# Patient Record
Sex: Female | Born: 1975 | Race: Black or African American | Hispanic: No | Smoking: Never smoker
Health system: Southern US, Community
[De-identification: ages and names within clinical notes are randomized; demographics above are authoritative.]

## PROBLEM LIST (undated history)

## (undated) DIAGNOSIS — E669 Obesity, unspecified: Secondary | ICD-10-CM

## (undated) DIAGNOSIS — I10 Essential (primary) hypertension: Secondary | ICD-10-CM

## (undated) HISTORY — PX: ECTOPIC PREGNANCY SURGERY: SHX613

---

## 2011-09-07 ENCOUNTER — Other Ambulatory Visit: Payer: Self-pay

## 2011-09-07 ENCOUNTER — Encounter (HOSPITAL_COMMUNITY): Payer: Self-pay | Admitting: Emergency Medicine

## 2011-09-07 ENCOUNTER — Emergency Department (HOSPITAL_COMMUNITY)
Admission: EM | Admit: 2011-09-07 | Discharge: 2011-09-08 | Disposition: A | Discharge status: Home / Self Care | Payer: Self-pay | Attending: Emergency Medicine | Admitting: Emergency Medicine

## 2011-09-07 DIAGNOSIS — R5381 Other malaise: Secondary | ICD-10-CM | POA: Insufficient documentation

## 2011-09-07 DIAGNOSIS — R071 Chest pain on breathing: Secondary | ICD-10-CM | POA: Insufficient documentation

## 2011-09-07 DIAGNOSIS — R0789 Other chest pain: Secondary | ICD-10-CM

## 2011-09-07 DIAGNOSIS — I10 Essential (primary) hypertension: Secondary | ICD-10-CM | POA: Insufficient documentation

## 2011-09-07 DIAGNOSIS — I517 Cardiomegaly: Secondary | ICD-10-CM | POA: Insufficient documentation

## 2011-09-07 DIAGNOSIS — R5383 Other fatigue: Secondary | ICD-10-CM | POA: Insufficient documentation

## 2011-09-07 DIAGNOSIS — R079 Chest pain, unspecified: Secondary | ICD-10-CM | POA: Insufficient documentation

## 2011-09-07 HISTORY — DX: Essential (primary) hypertension: I10

## 2011-09-07 NOTE — ED Notes (Signed)
Pt alert, nad, c/o "chest pain", onset several days ago, worse today, resp even unlabored, skin pwd, denies n/v

## 2011-09-07 NOTE — ED Notes (Signed)
Pt states she took four 81mg  ASA pta

## 2011-09-08 ENCOUNTER — Emergency Department (HOSPITAL_COMMUNITY): Payer: Self-pay

## 2011-09-08 LAB — POCT I-STAT, CHEM 8
BUN: 16 mg/dL (ref 6–23)
Creatinine, Ser: 0.6 mg/dL (ref 0.50–1.10)
Sodium: 141 mEq/L (ref 135–145)
TCO2: 23 mmol/L (ref 0–100)

## 2011-09-08 LAB — URINALYSIS, ROUTINE W REFLEX MICROSCOPIC
Ketones, ur: NEGATIVE mg/dL
Leukocytes, UA: NEGATIVE
Nitrite: NEGATIVE
Specific Gravity, Urine: 1.027 (ref 1.005–1.030)
pH: 5.5 (ref 5.0–8.0)

## 2011-09-08 LAB — POCT I-STAT TROPONIN I

## 2011-09-08 LAB — URINE MICROSCOPIC-ADD ON

## 2011-09-08 MED ORDER — TRAMADOL HCL 50 MG PO TABS: 50.0000 mg | ORAL_TABLET | Freq: Four times a day (QID) | ORAL | Status: AC | PRN

## 2011-09-08 MED ORDER — LISINOPRIL 5 MG PO TABS: 5.0000 mg | ORAL_TABLET | Freq: Every day | ORAL | Status: DC

## 2011-09-08 NOTE — ED Notes (Signed)
Attempted IV X2 without success.

## 2011-09-08 NOTE — ED Notes (Signed)
Patient transported to X-ray 

## 2011-09-08 NOTE — ED Provider Notes (Signed)
History     CSN: [id number]  Arrival date & time 09/07/11  2317   First MD Initiated Contact with Patient 09/07/11 2358      Chief Complaint  Patient presents with  . Chest Pain   HPI Pt was seen at 2355.  Per pt, c/o gradual onset and persistence of multiple intermittent episodes of chest "pain" that began 2 days ago.  Describes the pain as "sharp" and "stabbing," lasts 1-2 min before resolving spontaneously.  Discomfort does not radiate, is not assoc specifically with activity/rest/or meals, and is not assoc with any other symptoms.  Also states she has run out of her BP meds this past week and is requesting a med refill.  Denies SOB/cough, no palpitations, no fevers, no back pain, no abd pain, no N/V/D, no rash, no visual changes, no focal motor weakness, no tingling/numbness in extremities.   Past Medical History  Diagnosis Date  . Hypertension     Past Surgical History  Procedure Date  . Cesarean section      History  Substance Use Topics  . Smoking status: Never Smoker   . Smokeless tobacco: Not on file  . Alcohol Use: No    Review of Systems ROS: Statement: All systems negative except as marked or noted in the HPI; Constitutional: Negative for fever and chills. ; ; Eyes: Negative for eye pain, redness and discharge. ; ; ENMT: Negative for ear pain, hoarseness, nasal congestion, sinus pressure and sore throat. ; ; Cardiovascular: +chest pain. Negative for palpitations, diaphoresis, dyspnea and peripheral edema. ; ; Respiratory: Negative for cough, wheezing and stridor. ; ; Gastrointestinal: Negative for nausea, vomiting, diarrhea, abdominal pain, blood in stool, hematemesis, jaundice and rectal bleeding. . ; ; Genitourinary: Negative for dysuria, flank pain and hematuria. ; ; Musculoskeletal: Negative for back pain and neck pain. Negative for swelling and trauma.; ; Skin: Negative for pruritus, rash, abrasions, blisters, bruising and skin lesion.; ; Neuro: Negative for  headache, lightheadedness and neck stiffness. Negative for weakness, altered level of consciousness , altered mental status, extremity weakness, paresthesias, involuntary movement, seizure and syncope.     Allergies  Penicillins  Home Medications   Current Outpatient Rx  Name Route Sig Dispense Refill  . LISINOPRIL 5 MG PO TABS Oral Take 5 mg by mouth daily.      BP 119/70  Pulse 91  Temp(Src) 98 F (36.7 C) (Oral)  Resp 16  Wt 300 lb (136.079 kg)  SpO2 100%  LMP 08/24/2011  Physical Exam 0005: Physical examination:  Nursing notes reviewed; Vital signs and O2 SAT reviewed;  Constitutional: Well developed, Well nourished, Well hydrated, In no acute distress; Head:  Normocephalic, atraumatic; Eyes: EOMI, PERRL, No scleral icterus; ENMT: Mouth and pharynx normal, Mucous membranes moist; Neck: Supple, Full range of motion, No lymphadenopathy; Cardiovascular: Regular rate and rhythm, No murmur, rub, or gallop; Respiratory: Breath sounds clear & equal bilaterally, No rales, rhonchi, wheezes, or rub, Normal respiratory effort/excursion; Chest: Nontender, Movement normal; Abdomen: Soft, Nontender, Nondistended, Normal bowel sounds; Extremities: Pulses normal, No tenderness, No edema, No calf edema or asymmetry.; Neuro: AA&Ox3, Major CN grossly intact. Speech clear, no facial droop. No gross focal motor or sensory deficits in extremities.; Skin: Color normal, Warm, Dry, no rash.    ED Course  Procedures   MDM  MDM Reviewed: nursing note and vitals Interpretation: ECG, labs and x-ray    Date: 09/08/2011  Rate: 78  Rhythm: normal sinus rhythm  QRS Axis: normal  Intervals: normal  ST/T Wave abnormalities: normal  Conduction Disutrbances:none  Narrative Interpretation:   Old EKG Reviewed: none available.    Results for orders placed during the hospital encounter of 09/07/11  URINALYSIS, ROUTINE W REFLEX MICROSCOPIC      Component Value Range   Color, Urine YELLOW  YELLOW     APPearance CLOUDY (*) CLEAR    Specific Gravity, Urine 1.027  1.005 - 1.030    pH 5.5  5.0 - 8.0    Glucose, UA NEGATIVE  NEGATIVE (mg/dL)   Hgb urine dipstick SMALL (*) NEGATIVE    Bilirubin Urine NEGATIVE  NEGATIVE    Ketones, ur NEGATIVE  NEGATIVE (mg/dL)   Protein, ur NEGATIVE  NEGATIVE (mg/dL)   Urobilinogen, UA 0.2  0.0 - 1.0 (mg/dL)   Nitrite NEGATIVE  NEGATIVE    Leukocytes, UA NEGATIVE  NEGATIVE   D-DIMER, QUANTITATIVE      Component Value Range   D-Dimer, Quant 0.28  0.00 - 0.48 (ug/mL-FEU)  POCT PREGNANCY, URINE      Component Value Range   Preg Test, Ur NEGATIVE  NEGATIVE   URINE MICROSCOPIC-ADD ON      Component Value Range   Squamous Epithelial / LPF MANY (*) RARE    RBC / HPF 0-2  <3 (RBC/hpf)   Bacteria, UA MANY (*) RARE    Urine-Other MUCOUS PRESENT    POCT I-STAT, CHEM 8      Component Value Range   Sodium 141  135 - 145 (mEq/L)   Potassium 3.6  3.5 - 5.1 (mEq/L)   Chloride 108  96 - 112 (mEq/L)   BUN 16  6 - 23 (mg/dL)   Creatinine, Ser 4.09  0.50 - 1.10 (mg/dL)   Glucose, Bld 88  70 - 99 (mg/dL)   Calcium, Ion 8.11  9.14 - 1.32 (mmol/L)   TCO2 23  0 - 100 (mmol/L)   Hemoglobin 11.6 (*) 12.0 - 15.0 (g/dL)   HCT 78.2 (*) 95.6 - 46.0 (%)  POCT I-STAT TROPONIN I      Component Value Range   Troponin i, poc 0.00  0.00 - 0.08 (ng/mL)   Comment 3            Dg Chest 2 View 09/08/2011  *RADIOLOGY REPORT*  Clinical Data: Weakness and hypertension.  CHEST - 2 VIEW  Comparison: None.  Findings: Mild cardiac enlargement with prominent central pulmonary vascularity.  Changes suggest arterial hypertension.  Right inferior paratracheal prominence likely representing vascular shadow.  No focal airspace consolidation.  No blunting of costophrenic angles.  No pneumothorax.  Mild degenerative changes in the spine.  IMPRESSION: Cardiac enlargement with pulmonary vascular congestion and probable pulmonary arterial hypertension changes.  No focal airspace consolidation.  Original  Report Authenticated By: Marlon Pel, M.D.     3:07 AM:  UA contaminated.  Wants to go home now.  Doubt ACS or PE as cause for symptoms today.  BP, RR, Sats stable in ED.  Given Pulm MD to f/u with re: CXR findings above.  Dx testing (inclu CXR above) d/w pt and family.  Questions answered.  Verb understanding, agreeable to d/c home with outpt f/u.        Carmen Ruiz Allison Quarry, DO 09/08/11 2123

## 2012-05-20 ENCOUNTER — Encounter (HOSPITAL_COMMUNITY): Payer: Self-pay | Admitting: *Deleted

## 2012-05-20 DIAGNOSIS — I10 Essential (primary) hypertension: Secondary | ICD-10-CM | POA: Insufficient documentation

## 2012-05-20 DIAGNOSIS — R079 Chest pain, unspecified: Principal | ICD-10-CM | POA: Insufficient documentation

## 2012-05-20 LAB — CBC WITH DIFFERENTIAL/PLATELET
Basophils Relative: 0 % (ref 0–1)
Eosinophils Relative: 0 % (ref 0–5)
HCT: 32.9 % — ABNORMAL LOW (ref 36.0–46.0)
Hemoglobin: 10.7 g/dL — ABNORMAL LOW (ref 12.0–15.0)
Lymphocytes Relative: 26 % (ref 12–46)
MCHC: 32.5 g/dL (ref 30.0–36.0)
Monocytes Relative: 9 % (ref 3–12)
Neutro Abs: 5.2 10*3/uL (ref 1.7–7.7)
RBC: 5.46 MIL/uL — ABNORMAL HIGH (ref 3.87–5.11)
WBC: 8 10*3/uL (ref 4.0–10.5)

## 2012-05-20 LAB — COMPREHENSIVE METABOLIC PANEL
ALT: 22 U/L (ref 0–35)
AST: 30 U/L (ref 0–37)
Alkaline Phosphatase: 43 U/L (ref 39–117)
CO2: 23 mEq/L (ref 19–32)
GFR calc Af Amer: >90 mL/min (ref >90)
GFR calc non Af Amer: >90 mL/min (ref >90)
Glucose, Bld: 127 mg/dL — ABNORMAL HIGH (ref 70–99)
Potassium: 3.3 mEq/L — ABNORMAL LOW (ref 3.5–5.1)
Sodium: 136 mEq/L (ref 135–145)

## 2012-05-20 NOTE — ED Notes (Signed)
No bp meds for 2 weeks but earlier today she took a benicar from a friend

## 2012-05-20 NOTE — ED Notes (Signed)
The pt has had mid-chest pain for 3-4 days with nausea.  She said her heart was beating fast earlier today

## 2012-05-20 NOTE — ED Notes (Signed)
Wait time advised 

## 2012-05-21 ENCOUNTER — Emergency Department (HOSPITAL_COMMUNITY): Payer: Self-pay

## 2012-05-21 ENCOUNTER — Encounter (HOSPITAL_COMMUNITY): Payer: Self-pay

## 2012-05-21 ENCOUNTER — Observation Stay (HOSPITAL_COMMUNITY)
Admission: EM | Admit: 2012-05-21 | Discharge: 2012-05-21 | Disposition: A | Discharge status: Home / Self Care | Payer: 59 | Attending: Emergency Medicine | Admitting: Emergency Medicine

## 2012-05-21 DIAGNOSIS — D649 Anemia, unspecified: Secondary | ICD-10-CM | POA: Diagnosis present

## 2012-05-21 DIAGNOSIS — E876 Hypokalemia: Secondary | ICD-10-CM | POA: Diagnosis present

## 2012-05-21 DIAGNOSIS — R079 Chest pain, unspecified: Principal | ICD-10-CM | POA: Diagnosis present

## 2012-05-21 DIAGNOSIS — R739 Hyperglycemia, unspecified: Secondary | ICD-10-CM | POA: Diagnosis present

## 2012-05-21 LAB — POCT I-STAT TROPONIN I: Troponin i, poc: 0 ng/mL (ref 0.00–0.08)

## 2012-05-21 MED ORDER — ASPIRIN 81 MG PO CHEW
324.0000 mg | CHEWABLE_TABLET | Freq: Once | ORAL | Status: AC
  Administered 2012-05-21: 324 mg via ORAL

## 2012-05-21 MED ORDER — LISINOPRIL 10 MG PO TABS: 10.0000 mg | ORAL_TABLET | Freq: Every day | ORAL | Status: DC

## 2012-05-21 MED ORDER — GI COCKTAIL ~~LOC~~
30.0000 mL | Freq: Once | ORAL | Status: AC
  Administered 2012-05-21: 30 mL via ORAL
  Filled 2012-05-21: qty 30

## 2012-05-21 MED ORDER — POTASSIUM CHLORIDE CRYS ER 20 MEQ PO TBCR
20.0000 meq | EXTENDED_RELEASE_TABLET | Freq: Once | ORAL | Status: AC
  Administered 2012-05-21: 20 meq via ORAL
  Filled 2012-05-21: qty 1

## 2012-05-21 MED ORDER — ASPIRIN 81 MG PO CHEW
CHEWABLE_TABLET | ORAL | Status: AC
  Filled 2012-05-21: qty 4

## 2012-05-21 NOTE — Consult Note (Signed)
CARDIOLOGY CONSULT NOTE   Patient ID: Carmen Ruiz MRN: [id number] DOB/AGE: [birth year] 36 y.o.  Admit date: 05/21/2012  Primary Physician   Dr Mayford Knife at the [facility] on [address] Primary Cardiologist   none Reason for Consultation   Chest pain  Carmen Ruiz:[id number] Cluff is a 36 y.o. female with no history of CAD. She had chest pain and came to the ER. She is over 300 lbs and they could not do a GXT echo, so cardiology was asked to evaluate her.   Pt ran out of her BP meds about 2 weeks ago. Her BP has been as high as 180/101. She began having chest pain a week ago. It was intermittent, when she moved fast or her BP was up. It was a pulling sensation just to the left of her sternum. She also got a cramping pain under her breast. It would be worse with exertion and resolve without meds. Rest did not help. No change with position, deep inspiration or cough. Chest Carmen Ruiz not tender to palpation.    She has been having approx 5 episodes per day, each one lasting < 30". She knew her BP was high and took a friend's BP med yesterday. Later yesterday (about 8 pm), after eating, pt had sharper pain, associated with diaphoresis and hot feeling all over.  The pain reached a 10/10. She came to the ER. En route, without meds, the pain resolved. A GI cocktail helped a cramping pain in her chest today. Right now, pt still having intermittent cramping pain in her chest at a 2/10. The pain concerned her and with the diaphoresis, she thought she was having a heart attack.    Past Medical History  Diagnosis Date   She has not had a full physical in years   . Hypertension      Past Surgical History  Procedure Date  . Cesarean section   . Ectopic pregnancy surgery     Allergies  Allergen Reactions  . Penicillins Anaphylaxis  . Amlodipine Other (See Comments)    Facial tingling     I have reviewed the patient's current medications    . aspirin      . aspirin  324 mg Oral Once  . gi  cocktail  30 mL Oral Once  . potassium chloride  20 mEq Oral Once   Medication Sig Start Date  amLODipine (NORVASC) 10 MG tablet Take 10 mg by mouth daily.   lisinopril (PRINIVIL,ZESTRIL) 5 MG tablet Take 1 tablet (5 mg total) by mouth daily. 09/08/11  olmesartan (BENICAR) 20 MG tablet/HCT Take 20/? mg by mouth daily.    History   Social History  . Marital Status: Single    Spouse Name: N/A    Number of Children: N/A  . Years of Education: N/A   Occupational History  . 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 Smoking status: Never Smoker   . Smokeless tobacco: Not on file  . Alcohol Use: No  . Drug Use: No  . Sexually Active: Not on file   Other Topics Concern  . Not on file   Social History Narrative   No cardiac issues in any siblings. Pt lives with 2 daughters.   Family Status  Relation Status Death Age  . Mother Alive     38s, CHF, no CAD  . Father Alive     32s, CHF, no CAD   ROS: No recent illnesses, fevers  or chills. She does not cough or wheeze. No significant edema, no PND or orthopnea. Full 14 point review of systems complete and found to be negative unless listed above.  Physical Exam: Blood pressure 118/66, pulse 97, temperature 98.4 F (36.9 C), temperature source Oral, resp. rate 23, last menstrual period 05/06/2012, SpO2 99.00%. 65" 320 lbs General: Well developed, well nourished, female in no acute distress Head: Eyes PERRLA, No xanthomas.   Normocephalic and atraumatic, oropharynx without edema or exudate. Dentition: good Lungs: Clear to auscultation bilaterally Heart: HRRR S1 S2, no rub/gallop, very soft systolic murmur. pulses are 2+ all 4 extrem.   Neck: No carotid bruits. No lymphadenopathy.  JVD not elevated. Abdomen: Bowel sounds present, abdomen soft and some minor tenderness in the mid-epigastric area, guarding or rebound, no masses or hernias noted. Msk:  No spine or cva tenderness. No weakness, no joint deformities or  effusions. Extremities: No clubbing or cyanosis. No edema.  Neuro: Alert and oriented X 3. No focal deficits noted. Psych:  Good affect, responds appropriately Skin: No rashes or lesions noted.  Labs:  Lab Results  Component Value Date   WBC 8.0 05/20/2012   HGB 10.7* 05/20/2012   HCT 32.9* 05/20/2012   MCV 60.3* 05/20/2012   PLT 268 05/20/2012    Lab 05/20/12 2050  NA 136  K 3.3*  CL 98  CO2 23  BUN 14  CREATININE 0.70  CALCIUM 10.1  PROT 7.5  BILITOT 0.2*  ALKPHOS 43  ALT 22  AST 30  GLUCOSE 127*    Basename 05/20/12 2050  CKTOTAL --  CKMB --  TROPONINI <0.30    Basename 05/21/12 0645  TROPIPOC 0.00   Lab Results  Component Value Date   DDIMER <0.27 05/21/2012   ECG: 21-May-2012 05:57:27 [facility]/ED ROUTINE RECORD SINUS RHYTHM ~ normal P axis, V-rate 50- 99 BORDERLINE T ABNORMALITIES, ANTERIOR LEADS ~ T flat or neg, V2-V4 Borderline ECG No significant change since last tracing 53mm/s 32mm/mV 150Hz  8.0.1 12SL 235 CID: [id number] Referred by: Confirmed By: Dione Booze MD Vent. rate 95 BPM PR interval 164 ms QRS duration 74 ms QT/QTc 344/432 ms P-R-T axes 55 72 7  07-Sep-2011 23:39:24  [facility] ED ROUTINE RECORD SINUS RHYTHM ~ normal P axis, V-rate 50- 99 Normal ECG No old tracing to compare 4mm/s 55mm/mV 150Hz  7.1.1 12SL 235 CID: [id number] Referred by: Confirmed By: Devoria Albe MD-I Vent. rate 78 BPM PR interval 188 ms QRS duration 82 ms QT/QTc 348/396 ms P-R-T axes 55 80 21  Radiology:  Dg Chest 2 View 05/21/2012  *RADIOLOGY REPORT*  Clinical Data: Chest pain and congestion for 2 days.  CHEST - 2 VIEW  Comparison: 09/08/2011  Findings: The heart size and pulmonary vascularity are normal. The lungs appear clear and expanded without focal air space disease or consolidation. No blunting of the costophrenic angles.  No pneumothorax.  Mediastinal contours appear intact.  Mild degenerative changes in the spine.  IMPRESSION:  No evidence of active pulmonary disease.   Original Report Authenticated By: Marlon Pel, M.D.    ASSESSMENT AND PLAN:   The patient was seen today by Dr Daleen Squibb, the patient evaluated and the data reviewed.  Principal Problem:  *Chest pain on exertion - Pt has never had an ischemic eval but her symptoms are not consistent with angina. Her ECG has some changes from an ECG in January, but her hypertension has not been treated and and she was hypokalemic at the time.  She also had LVH/strain by initial ECG, but this improved. She had a significant episode of pain last pm without any change in her enzymes. We will increase her lisinopril to 10 mg daily for better BP control. Encouraged a healthy diet with fruit for potassium supplement. Dr Daleen Squibb spent > 20 minutes discussing the patient's situation and ways to improve her health. No further in-pt workup indicated.   Active Problems:  Hypokalemia - s/p supplement, ECG changes improved this am. She was not on a daily diuretic but took some of another person's meds.   Anemia - present in Jan 2013, but slightly worse now. Her MCV is low, consider MVI. She is still menstruating.   Hyperglycemia - recheck as fasting with HgbA1c.   Signed: Theodore Demark 05/21/2012, 2:46 PM Co-Sign MD I have taken a history, reviewed medications, allergies, PMH, SH, FH, and reviewed ROS and examined the patient.  I agree with the assessment and plan. Long discussion with patient about compliance with meds for BP, weight loss, intake of potassium rich foods. EKG changes most likely secondary to LVH and hypokalemia and much improved with BP control and potassium replacement. OK to be discharged home.  Angelo Caroll C. Daleen Squibb, MD, [facility] Pager:  [phone]

## 2012-05-21 NOTE — ED Notes (Signed)
Pt. oob to the bathroom, brushed her teeth, freshened up.  Pt. Has been NPO, denies any chest pain

## 2012-05-21 NOTE — ED Notes (Signed)
Patient transported to X-ray 

## 2012-05-21 NOTE — ED Provider Notes (Signed)
Wynetta Emery, PA-C, discussed patient with Dr Daleen Squibb.  She has been seen in CDU by [facility] cardiology.  Please see consult note by Theodore Demark, PA-C.  Bjorn Loser has given patient prescription for lisinopril.  I will discharge per sign out report of their discussion and Rhonda's note.    4:06 PM I spoke with the patient who verabalizes understanding and agrees with d/c home.  Pt has resources for PCP follow up and has been given a business card by Dr Daleen Squibb.  I have encouraged follow up.    [place], [place] 05/21/12 1737

## 2012-05-21 NOTE — ED Notes (Signed)
Pt. Unable to complete Stress Echo due to weight .

## 2012-05-21 NOTE — ED Provider Notes (Signed)
History     CSN: [id number]  Arrival date & time 05/20/12  2026   First MD Initiated Contact with Patient 05/21/12 0043      Chief Complaint  Patient presents with  . Chest Pain    (Consider location/radiation/quality/duration/timing/severity/associated sxs/prior treatment) HPI Hx per PT, ran out of Lisinopril 2 weeks ago, CP started a week ago on and off worse today, feels like an ache, located L chest, no radiation, some L arm tingling. No SOB, some palpitations and states her HR has been elevated at home over 100, unusual for her. No PCP. No leg pain or swelling. No recent travel or surgery, no h/o DVT or PE. No DM, HLD or CAD. Had a stress test about 5 years ago, reports it was OK. Currently pain free, takes ASA daily.  Past Medical History  Diagnosis Date  . Hypertension     Past Surgical History  Procedure Date  . Cesarean section     No family history on file.  History  Substance Use Topics  . Smoking status: Never Smoker   . Smokeless tobacco: Not on file  . Alcohol Use: No    OB History    Grav Para Term Preterm Abortions TAB SAB Ect Mult Living                  Review of Systems  Constitutional: Negative for fever and chills.  HENT: Negative for neck pain and neck stiffness.   Eyes: Negative for pain.  Respiratory: Negative for shortness of breath.   Cardiovascular: Positive for chest pain.  Gastrointestinal: Negative for abdominal pain.  Genitourinary: Negative for dysuria.  Musculoskeletal: Negative for back pain.  Skin: Negative for rash.  Neurological: Negative for headaches.  All other systems reviewed and are negative.    Allergies  Penicillins and Amlodipine  Home Medications   Current Outpatient Rx  Name Route Sig Dispense Refill  . AMLODIPINE BESYLATE 10 MG PO TABS Oral Take 10 mg by mouth daily.    Marland Kitchen LISINOPRIL 5 MG PO TABS Oral Take 1 tablet (5 mg total) by mouth daily. 15 tablet 0  . OLMESARTAN MEDOXOMIL 20 MG PO TABS Oral Take  20 mg by mouth daily.      BP 134/84  Pulse 91  Temp 98.3 F (36.8 C) (Oral)  Resp 16  SpO2 100%  LMP 05/06/2012  Physical Exam  Constitutional: She is oriented to person, place, and time. She appears well-developed and well-nourished.  HENT:  Head: Normocephalic and atraumatic.  Eyes: Conjunctivae normal and EOM are normal. Pupils are equal, round, and reactive to light.  Neck: Trachea normal. Neck supple. No thyromegaly present.  Cardiovascular: Normal rate, regular rhythm, S1 normal, S2 normal and normal pulses.     No systolic murmur is present   No diastolic murmur is present  Pulses:      Radial pulses are 2+ on the right side, and 2+ on the left side.  Pulmonary/Chest: Effort normal and breath sounds normal. She has no wheezes. She has no rhonchi. She has no rales. She exhibits no tenderness.  Abdominal: Soft. Normal appearance and bowel sounds are normal. There is no tenderness. There is no CVA tenderness and negative Murphy's sign.  Musculoskeletal:       BLE:s Calves nontender, no cords or erythema, negative Homans sign  Neurological: She is alert and oriented to person, place, and time. She has normal strength. No cranial nerve deficit or sensory deficit. GCS eye subscore is 4.  GCS verbal subscore is 5. GCS motor subscore is 6.  Skin: Skin is warm and dry. No rash noted. She is not diaphoretic.  Psychiatric: Her speech is normal.       Cooperative and appropriate    ED Course  Procedures (including critical care time)  Results for orders placed during the hospital encounter of 05/21/12  CBC WITH DIFFERENTIAL      Component Value Range   WBC 8.0  4.0 - 10.5 K/uL   RBC 5.46 (*) 3.87 - 5.11 MIL/uL   Hemoglobin 10.7 (*) 12.0 - 15.0 g/dL   HCT 45.4 (*) 09.8 - 11.9 %   MCV 60.3 (*) 78.0 - 100.0 fL   MCH 19.6 (*) 26.0 - 34.0 pg   MCHC 32.5  30.0 - 36.0 g/dL   RDW 14.7  82.9 - 56.2 %   Platelets 268  150 - 400 K/uL   Neutrophils Relative 65  43 - 77 %   Lymphocytes  Relative 26  12 - 46 %   Monocytes Relative 9  3 - 12 %   Eosinophils Relative 0  0 - 5 %   Basophils Relative 0  0 - 1 %   Neutro Abs 5.2  1.7 - 7.7 K/uL   Lymphs Abs 2.1  0.7 - 4.0 K/uL   Monocytes Absolute 0.7  0.1 - 1.0 K/uL   Eosinophils Absolute 0.0  0.0 - 0.7 K/uL   Basophils Absolute 0.0  0.0 - 0.1 K/uL   RBC Morphology TARGET CELLS     Smear Review PLATELETS APPEAR ADEQUATE    COMPREHENSIVE METABOLIC PANEL      Component Value Range   Sodium 136  135 - 145 mEq/L   Potassium 3.3 (*) 3.5 - 5.1 mEq/L   Chloride 98  96 - 112 mEq/L   CO2 23  19 - 32 mEq/L   Glucose, Bld 127 (*) 70 - 99 mg/dL   BUN 14  6 - 23 mg/dL   Creatinine, Ser 1.30  0.50 - 1.10 mg/dL   Calcium 86.5  8.4 - 78.4 mg/dL   Total Protein 7.5  6.0 - 8.3 g/dL   Albumin 4.1  3.5 - 5.2 g/dL   AST 30  0 - 37 U/L   ALT 22  0 - 35 U/L   Alkaline Phosphatase 43  39 - 117 U/L   Total Bilirubin 0.2 (*) 0.3 - 1.2 mg/dL   GFR calc non Af Amer >90  >90 mL/min   GFR calc Af Amer >90  >90 mL/min  TROPONIN I      Component Value Range   Troponin I <0.30  <0.30 ng/mL  D-DIMER, QUANTITATIVE      Component Value Range   D-Dimer, Quant <0.27  0.00 - 0.48 ug/mL-FEU   Dg Chest 2 View  05/21/2012  *RADIOLOGY REPORT*  Clinical Data: Chest pain and congestion for 2 days.  CHEST - 2 VIEW  Comparison: 09/08/2011  Findings: The heart size and pulmonary vascularity are normal. The lungs appear clear and expanded without focal air space disease or consolidation. No blunting of the costophrenic angles.  No pneumothorax.  Mediastinal contours appear intact.  Mild degenerative changes in the spine.  IMPRESSION: No evidence of active pulmonary disease.   Original Report Authenticated By: Marlon Pel, M.D.      Date: 05/21/2012  Rate: 94  Rhythm: normal sinus rhythm  QRS Axis: normal  Intervals: normal  ST/T Wave abnormalities: nonspecific ST/T changes  Conduction Disutrbances:none  Narrative Interpretation: new mild iNF/ LAT  lead T wave inversions versus previous ECG 09/07/11  Old EKG Reviewed: changes noted   Borderline tachycardia with new T wave changes on ECG, d-dimer sent.   potassium for mild hypokalemia MDM   CP  ASA PO. No pain the ED. Low risk ACS CP protocol, plan stress test in am. Neg d-dimer.       Sunnie Nielsen, MD 05/21/12 [phone]

## 2012-05-21 NOTE — ED Provider Notes (Signed)
Carmen Ruiz is a 36 y.o. female in CDU on chest pain protocol signed out from pod B by Dr. Dierdre Highman: Patient is scheduled for a stress echo this morning on October 15. Risk factors include obesity, hypertension, d-dimer and troponin are negative however EKG shows nonspecific changes of flattening of the T waves in the inferior/lateral leads  0740AM patient resting comfortably asleep in her room, with CPAP in place. Patient states her pain is minimal she has no complaints at this time. Patient describes her pain as worse after eating. Physical exam shows no Murphy's sign. Advised patient not to deep before testing this morning.  Patient sent to stress echo this a.m. however she is an appropriate candidate for this test as her way to exceed to the maximum by 20 pounds. Discussed this with attending Dr. Preston Fleeting who recommends outpatient provocative testing in the next 2-3 days. Cardiology consult pending. Discussed this with patient and was concerneI about payment because she is uninsured. Patient also has a refill on her hypertension medications.  Cardiology consult from Dr. Daleen Squibb appreciated. He feels that outpatient provocative testing is not indicated at this time.   Sign out given at shift change to PA [facility], PA-C 05/22/12 1234

## 2012-05-22 NOTE — ED Provider Notes (Signed)
Medical screening examination/treatment/procedure(s) were conducted as a shared visit with non-physician practitioner(s) and myself.  I personally evaluated the patient during the encounter  Sunnie Nielsen, MD 05/22/12 [phone]

## 2012-05-22 NOTE — ED Provider Notes (Signed)
Medical screening examination/treatment/procedure(s) were performed by non-physician practitioner and as supervising physician I was immediately available for consultation/collaboration.   Dilia Alemany, MD 05/22/12 1530 

## 2014-05-27 ENCOUNTER — Encounter (HOSPITAL_COMMUNITY): Payer: Self-pay | Admitting: Emergency Medicine

## 2014-05-27 DIAGNOSIS — E669 Obesity, unspecified: Secondary | ICD-10-CM | POA: Insufficient documentation

## 2014-05-27 DIAGNOSIS — Z880 Allergy status to penicillin: Secondary | ICD-10-CM | POA: Insufficient documentation

## 2014-05-27 DIAGNOSIS — I10 Essential (primary) hypertension: Secondary | ICD-10-CM | POA: Insufficient documentation

## 2014-05-27 DIAGNOSIS — R51 Headache: Secondary | ICD-10-CM | POA: Insufficient documentation

## 2014-05-27 DIAGNOSIS — Z79899 Other long term (current) drug therapy: Secondary | ICD-10-CM | POA: Insufficient documentation

## 2014-05-27 DIAGNOSIS — R110 Nausea: Secondary | ICD-10-CM | POA: Insufficient documentation

## 2014-05-27 NOTE — ED Notes (Signed)
Patient presents with c/o headache ?due to HTN.  Went to see MD this morning due to some tongue swelling.  Discontinued Lisinopril and to start HCTZ in the AM

## 2014-05-28 ENCOUNTER — Emergency Department (HOSPITAL_COMMUNITY): Payer: Self-pay

## 2014-05-28 ENCOUNTER — Emergency Department (HOSPITAL_COMMUNITY)
Admission: EM | Admit: 2014-05-28 | Discharge: 2014-05-28 | Disposition: A | Discharge status: Home / Self Care | Payer: Self-pay | Attending: Emergency Medicine | Admitting: Emergency Medicine

## 2014-05-28 DIAGNOSIS — R519 Headache, unspecified: Secondary | ICD-10-CM

## 2014-05-28 DIAGNOSIS — R51 Headache: Secondary | ICD-10-CM

## 2014-05-28 DIAGNOSIS — I10 Essential (primary) hypertension: Secondary | ICD-10-CM

## 2014-05-28 HISTORY — DX: Obesity, unspecified: E66.9

## 2014-05-28 LAB — BASIC METABOLIC PANEL
Anion gap: 12 (ref 5–15)
BUN: 11 mg/dL (ref 6–23)
CO2: 26 meq/L (ref 19–32)
Calcium: 9.2 mg/dL (ref 8.4–10.5)
Chloride: 102 mEq/L (ref 96–112)
Creatinine, Ser: 0.68 mg/dL (ref 0.50–1.10)
GFR calc Af Amer: >90 mL/min (ref >90)
GLUCOSE: 99 mg/dL (ref 70–99)
POTASSIUM: 4 meq/L (ref 3.7–5.3)
Sodium: 140 mEq/L (ref 137–147)

## 2014-05-28 LAB — CBC
HCT: 31 % — ABNORMAL LOW (ref 36.0–46.0)
Hemoglobin: 9.8 g/dL — ABNORMAL LOW (ref 12.0–15.0)
MCH: 19.4 pg — ABNORMAL LOW (ref 26.0–34.0)
MCHC: 31.6 g/dL (ref 30.0–36.0)
MCV: 61.5 fL — ABNORMAL LOW (ref 78.0–100.0)
Platelets: 299 K/uL (ref 150–400)
RBC: 5.04 MIL/uL (ref 3.87–5.11)
RDW: 15.5 % (ref 11.5–15.5)
WBC: 6.8 K/uL (ref 4.0–10.5)

## 2014-05-28 LAB — TROPONIN I

## 2014-05-28 MED ORDER — OXYCODONE-ACETAMINOPHEN 5-325 MG PO TABS
2.0000 | ORAL_TABLET | Freq: Once | ORAL | Status: AC
  Administered 2014-05-28: 2 via ORAL
  Filled 2014-05-28: qty 2

## 2014-05-28 MED ORDER — ONDANSETRON 4 MG PO TBDP
4.0000 mg | ORAL_TABLET | Freq: Once | ORAL | Status: AC
  Administered 2014-05-28: 4 mg via ORAL
  Filled 2014-05-28: qty 1

## 2014-05-28 NOTE — Discharge Instructions (Signed)
Migraine Headache A migraine headache is very bad, throbbing pain on one or both sides of your head. Talk to your doctor about what things may bring on (trigger) your migraine headaches. HOME CARE  Only take medicines as told by your doctor.  Lie down in a dark, quiet room when you have a migraine.  Keep a journal to find out if certain things bring on migraine headaches. For example, write down:  What you eat and drink.  How much sleep you get.  Any change to your diet or medicines.  Lessen how much alcohol you drink.  Quit smoking if you smoke.  Get enough sleep.  Lessen any stress in your life.  Keep lights dim if bright lights bother you or make your migraines worse. GET HELP RIGHT AWAY IF:   Your migraine becomes really bad.  You have a fever.  You have a stiff neck.  You have trouble seeing.  Your muscles are weak, or you lose muscle control.  You lose your balance or have trouble walking.  You feel like you will pass out (faint), or you pass out.  You have really bad symptoms that are different than your first symptoms. MAKE SURE YOU:   Understand these instructions.  Will watch your condition.  Will get help right away if you are not doing well or get worse. Document Released: 05/02/2008 Document Revised: 10/16/2011 Document Reviewed: 03/31/2013 [facility] Patient Information 2015 [employer], [place]. This information is not intended to replace advice given to you by your health care provider. Make sure you discuss any questions you have with your health care provider.  Hypertension Hypertension, commonly called high blood pressure, is when the force of blood pumping through your arteries is too strong. Your arteries are the blood vessels that carry blood from your heart throughout your body. A blood pressure reading consists of a higher number over a lower number, such as 110/72. The higher number (systolic) is the pressure inside your arteries when your heart  pumps. The lower number (diastolic) is the pressure inside your arteries when your heart relaxes. Ideally you want your blood pressure below 120/80. Hypertension forces your heart to work harder to pump blood. Your arteries may become narrow or stiff. Having hypertension puts you at risk for heart disease, stroke, and other problems.  RISK FACTORS Some risk factors for high blood pressure are controllable. Others are not.  Risk factors you cannot control include:   Race. You may be at higher risk if you are African American.  Age. Risk increases with age.  Gender. Men are at higher risk than women before age 345 years. After age 38, women are at higher risk than men. Risk factors you can control include:  Not getting enough exercise or physical activity.  Being overweight.  Getting too much fat, sugar, calories, or salt in your diet.  Drinking too much alcohol. SIGNS AND SYMPTOMS Hypertension does not usually cause signs or symptoms. Extremely high blood pressure (hypertensive crisis) may cause headache, anxiety, shortness of breath, and nosebleed. DIAGNOSIS  To check if you have hypertension, your health care provider will measure your blood pressure while you are seated, with your arm held at the level of your heart. It should be measured at least twice using the same arm. Certain conditions can cause a difference in blood pressure between your right and left arms. A blood pressure reading that is higher than normal on one occasion does not mean that you need treatment. If one blood  pressure reading is high, ask your health care provider about having it checked again. TREATMENT  Treating high blood pressure includes making lifestyle changes and possibly taking medicine. Living a healthy lifestyle can help lower high blood pressure. You may need to change some of your habits. Lifestyle changes may include:  Following the DASH diet. This diet is high in fruits, vegetables, and whole  grains. It is low in salt, red meat, and added sugars.  Getting at least 2 hours of brisk physical activity every week.  Losing weight if necessary.  Not smoking.  Limiting alcoholic beverages.  Learning ways to reduce stress. If lifestyle changes are not enough to get your blood pressure under control, your health care provider may prescribe medicine. You may need to take more than one. Work closely with your health care provider to understand the risks and benefits. HOME CARE INSTRUCTIONS  Have your blood pressure rechecked as directed by your health care provider.   Take medicines only as directed by your health care provider. Follow the directions carefully. Blood pressure medicines must be taken as prescribed. The medicine does not work as well when you skip doses. Skipping doses also puts you at risk for problems.   Do not smoke.   Monitor your blood pressure at home as directed by your health care provider. SEEK MEDICAL CARE IF:   You think you are having a reaction to medicines taken.  You have recurrent headaches or feel dizzy.  You have swelling in your ankles.  You have trouble with your vision. SEEK IMMEDIATE MEDICAL CARE IF:  You develop a severe headache or confusion.  You have unusual weakness, numbness, or feel faint.  You have severe chest or abdominal pain.  You vomit repeatedly.  You have trouble breathing. MAKE SURE YOU:   Understand these instructions.  Will watch your condition.  Will get help right away if you are not doing well or get worse. Document Released: 07/24/2005 Document Revised: 12/08/2013 Document Reviewed: 05/16/2013 [facility]Exit[facility] Patient Information 2015 [place], [place]. This information is not intended to replace advice given to you by your health care provider. Make sure you discuss any questions you have with your health care provider.

## 2014-05-28 NOTE — ED Provider Notes (Signed)
CSN: [id number]     Arrival date & time 05/27/14  2339 History   First MD Initiated Contact with Patient 05/28/14 0124     Chief Complaint  Patient presents with  . Hypertension      HPI  Patient presents with complaint of a headache. History hypertension. She's had intermittent lip swelling on lisinopril. Bass, her lisinopril was stopped 2 days ago. Scheduled to start hydrochlorothiazide tomorrow. She developed a left-sided throbbing headache today and presents for evaluation. Throbbing left-sided, mild associated nausea. No vision changes. No chest pain. No numbness weakness or tingling to extremities or stroke symptoms.  Past Medical History  Diagnosis Date  . Hypertension   . Obesity    Past Surgical History  Procedure Laterality Date  . Cesarean section    . Ectopic pregnancy surgery     No family history on file. History  Substance Use Topics  . Smoking status: Never Smoker   . Smokeless tobacco: Not on file  . Alcohol Use: No   OB History   Grav Para Term Preterm Abortions TAB SAB Ect Mult Living                 Review of Systems  Constitutional: Negative for fever, chills, diaphoresis, appetite change and fatigue.  HENT: Negative for mouth sores, sore throat and trouble swallowing.        Throwing yesterday. Resolved today.  Eyes: Negative for visual disturbance.  Respiratory: Negative for cough, chest tightness, shortness of breath and wheezing.   Cardiovascular: Negative for chest pain.  Gastrointestinal: Negative for nausea, vomiting, abdominal pain, diarrhea and abdominal distention.  Endocrine: Negative for polydipsia, polyphagia and polyuria.  Genitourinary: Negative for dysuria, frequency and hematuria.  Musculoskeletal: Negative for gait problem.  Skin: Negative for color change, pallor and rash.  Neurological: Positive for headaches. Negative for dizziness, syncope and light-headedness.  Hematological: Does not bruise/bleed easily.    Psychiatric/Behavioral: Negative for behavioral problems and confusion.      Allergies  Penicillins; Amlodipine; and Lisinopril  Home Medications   Prior to Admission medications   Medication Sig Start Date End Date Taking? Authorizing Provider  aspirin EC 81 MG tablet Take 81 mg by mouth daily as needed for mild pain.   Yes Historical Provider, MD  Chlorphen-Phenyleph-APAP (TYLENOL SINUS CONGESTION/PAIN) 2-5-325 & 5-325 MG MISC Take 2 tablets by mouth daily as needed (for alleriges).   Yes Historical Provider, MD  hydrochlorothiazide (HYDRODIURIL) 25 MG tablet Take 25 mg by mouth daily.   Yes Historical Provider, MD  ibuprofen (ADVIL,MOTRIN) 200 MG tablet Take 800 mg by mouth every 6 (six) hours as needed for headache.   Yes Historical Provider, MD  lisinopril (PRINIVIL,ZESTRIL) 10 MG tablet Take 10 mg by mouth daily.   Yes Historical Provider, MD   BP 124/67  Pulse 74  Temp(Src) 98 F (36.7 C) (Oral)  Resp 18  Ht 5\' 5"  (1.651 m)  Wt 326 lb (147.873 kg)  BMI 54.25 kg/m2  SpO2 100%  LMP 05/22/2014 Physical Exam  Constitutional: She is oriented to person, place, and time. She appears well-developed and well-nourished. No distress.  HENT:  Head: Normocephalic.    Tongue appears normal  Eyes: Conjunctivae are normal. Pupils are equal, round, and reactive to light. No scleral icterus.  Neck: Normal range of motion. Neck supple. No thyromegaly present.  Cardiovascular: Normal rate and regular rhythm.  Exam reveals no gallop and no friction rub.   No murmur heard. Pulmonary/Chest: Effort normal and breath sounds  normal. No respiratory distress. She has no wheezes. She has no rales.  Abdominal: Soft. Bowel sounds are normal. She exhibits no distension. There is no tenderness. There is no rebound.  Musculoskeletal: Normal range of motion.  Neurological: She is alert and oriented to person, place, and time.  Emergency accident. Normal strength and sensation distally.  Skin: Skin  is warm and dry. No rash noted.  Psychiatric: She has a normal mood and affect. Her behavior is normal.    ED Course  Procedures (including critical care time) Labs Review Labs Reviewed  CBC - Abnormal; Notable for the following:    Hemoglobin 9.8 (*)    HCT 31.0 (*)    MCV 61.5 (*)    MCH 19.4 (*)    All other components within normal limits  BASIC METABOLIC PANEL  TROPONIN I    Imaging Review Dg Chest 2 View  05/28/2014   CLINICAL DATA:  38 year old female with headache and hypertension. Pain under the left breast. Initial encounter.  EXAM: CHEST  2 VIEW  COMPARISON:  05/21/2012.  FINDINGS: Lung volumes stable and within normal limits. Normal cardiac size and mediastinal contours. Visualized tracheal air column is within normal limits. No pneumothorax, pulmonary edema, pleural effusion or confluent pulmonary opacity. No acute osseous abnormality identified. Large body habitus.  IMPRESSION: No acute cardiopulmonary abnormality.   Electronically Signed   By: Augusto GambleLee  Hall M.D.   On: 05/28/2014 00:39   Ct Head Wo Contrast  05/28/2014   CLINICAL DATA:  38 year old female with headache for 2 days, vertigo. Light sensitivity. Initial encounter.  EXAM: CT HEAD WITHOUT CONTRAST  TECHNIQUE: Contiguous axial images were obtained from the base of the skull through the vertex without intravenous contrast.  COMPARISON:  None.  FINDINGS: Visualized paranasal sinuses and mastoids are clear. No acute osseous abnormality identified. Visualized orbit soft tissues are within normal limits. Negative scalp soft tissues (right occipital sebaceous cyst).  Cerebral volume is normal. Partially empty sella configuration. No midline shift, ventriculomegaly, mass effect, evidence of mass lesion, intracranial hemorrhage or evidence of cortically based acute infarction. Gray-white matter differentiation is within normal limits throughout the brain. No suspicious intracranial vascular hyperdensity.  IMPRESSION: Negative  noncontrast CT appearance of the brain.   Electronically Signed   By: Augusto GambleLee  Hall M.D.   On: 05/28/2014 02:10     EKG Interpretation None      MDM   Final diagnoses:  Acute nonintractable headache, unspecified headache type  Essential hypertension    Pressure may never get control. Headache free after the by mouth meds. Normal CT. Normal renal function. She is appropriate to start her hydrochlorothiazide tomorrow.    Rolland PorterMark Christophe Rising, MD 05/28/14 [phone]

## 2014-11-24 ENCOUNTER — Encounter: Payer: Self-pay | Admitting: Pulmonary Disease

## 2014-11-26 ENCOUNTER — Institutional Professional Consult (permissible substitution): Payer: Self-pay | Admitting: Pulmonary Disease

## 2015-01-21 IMAGING — CT CT HEAD W/O CM
1 of 2 series · 16 of 30 positions shown, 20 images · non-contrast
Comparison: None.

CLINICAL DATA: 38-year-old female with headache for 2 days,
vertigo. Light sensitivity. Initial encounter.

EXAM:
CT HEAD WITHOUT CONTRAST
TECHNIQUE: Contiguous axial images were obtained from the base of the skull
through the vertex without intravenous contrast.

[Series 3: head 2.0 h70h · axial · 0.49mm/px · z∈[-90,+38]mm · 16 of 72 slices shown, 20 images]
[im 4/72  brain]
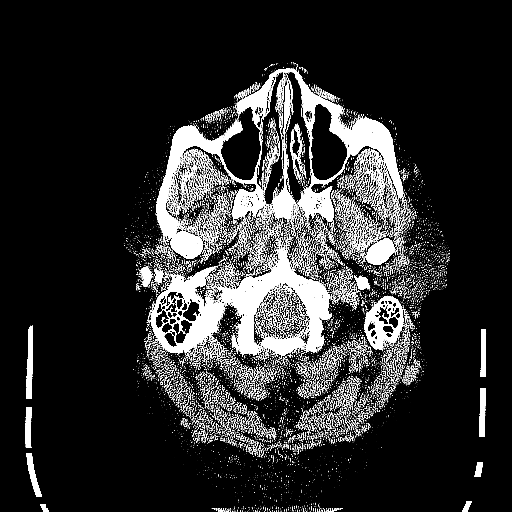
[im 4/72  bone]
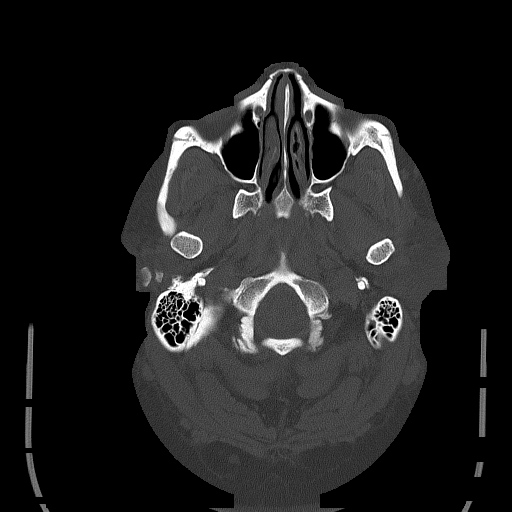
[im 8/72  brain]
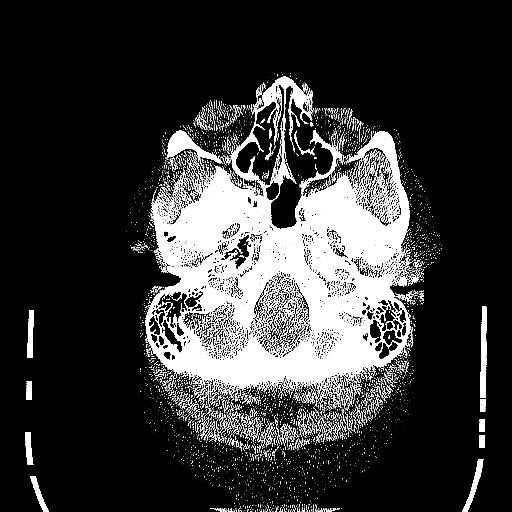
[im 11/72  brain]
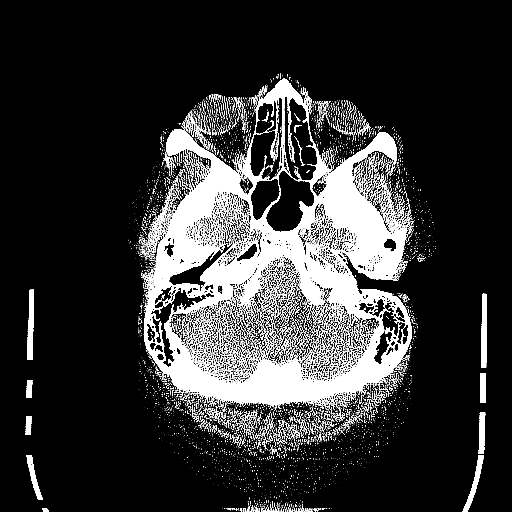
[im 18/72  brain]
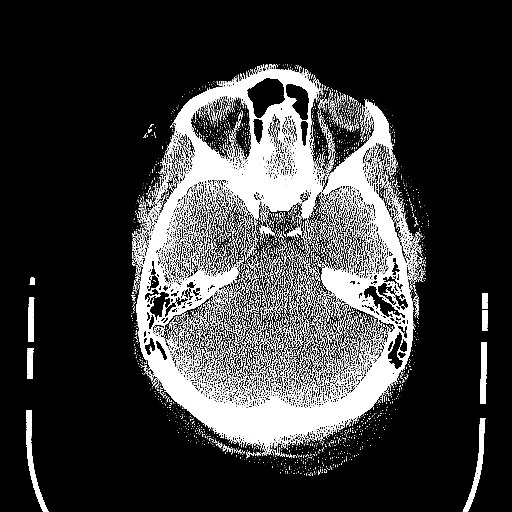
[im 22/72  brain]
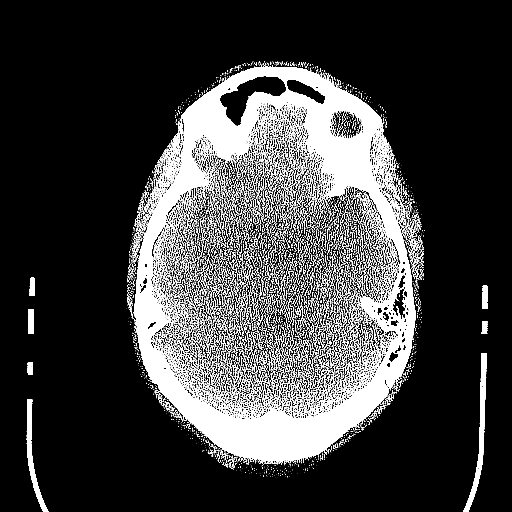
[im 22/72  bone]
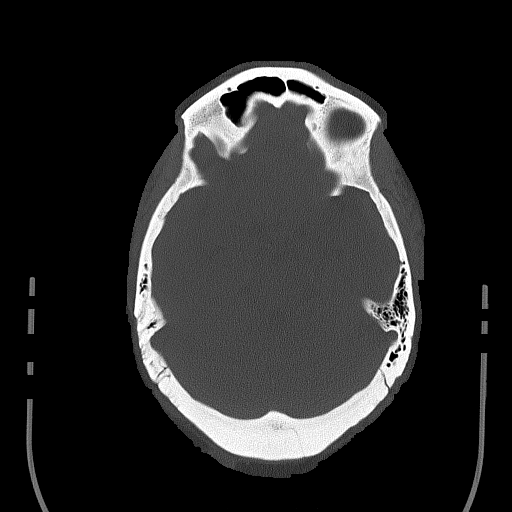
[im 25/72  brain]
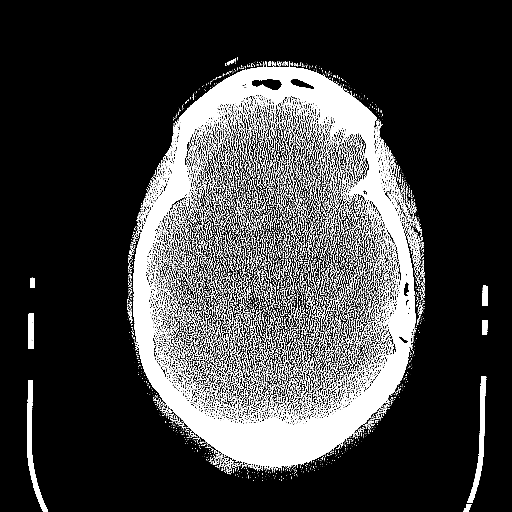
[im 29/72  brain]
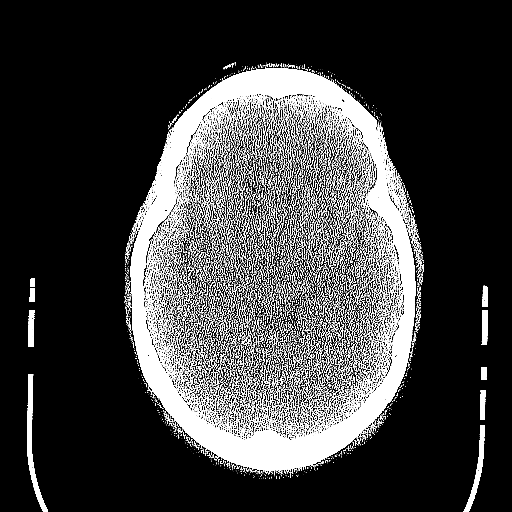
[im 32/72  brain]
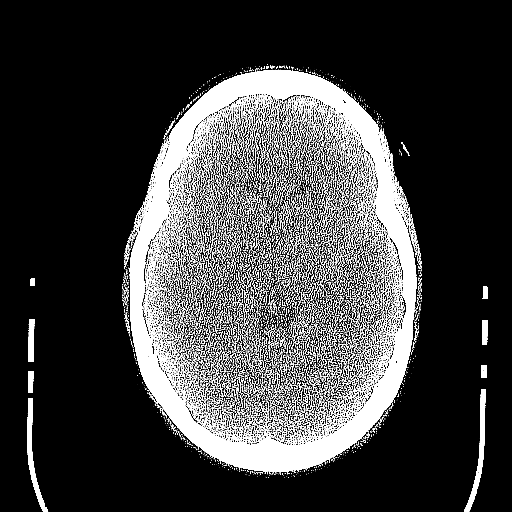
[im 40/72  brain]
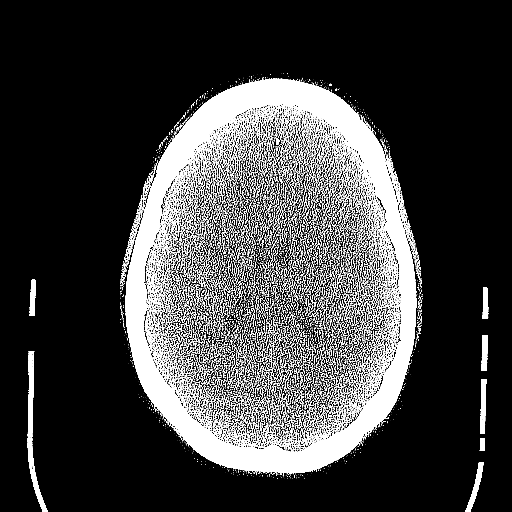
[im 40/72  bone]
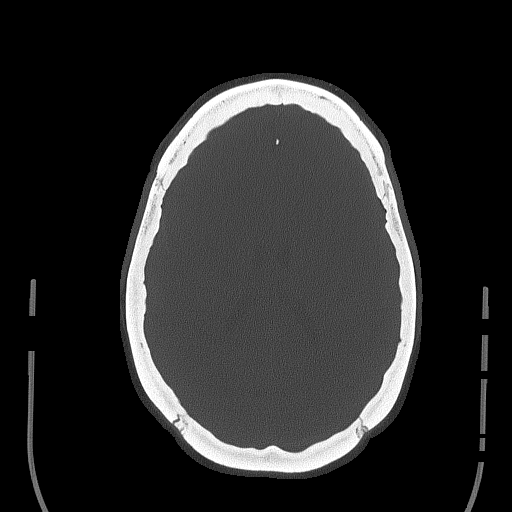
[im 43/72  brain]
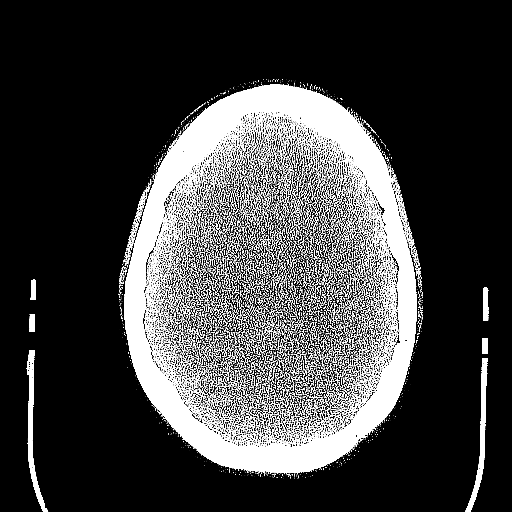
[im 47/72  brain]
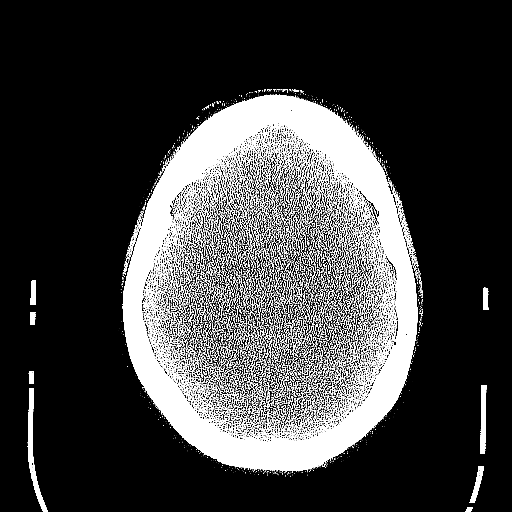
[im 50/72  brain]
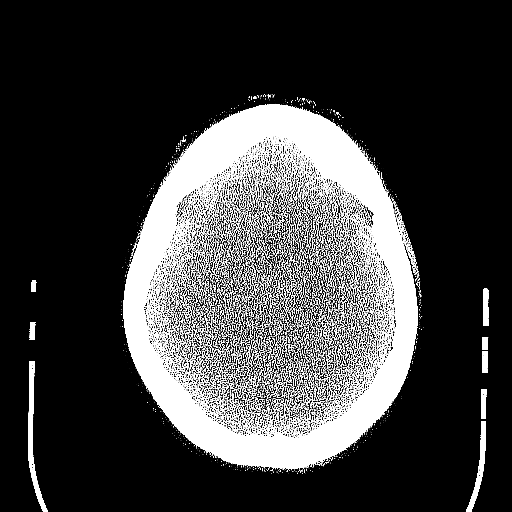
[im 54/72  brain]
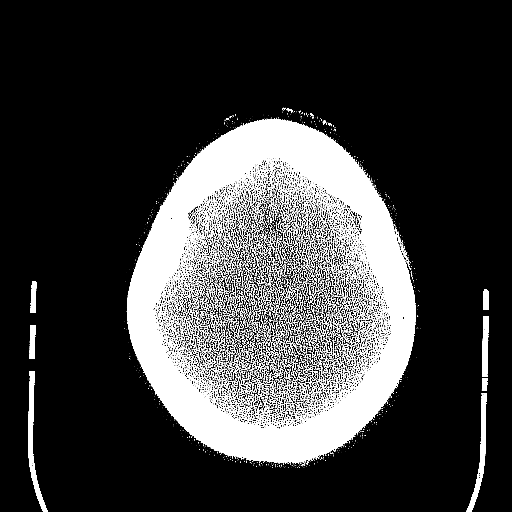
[im 54/72  bone]
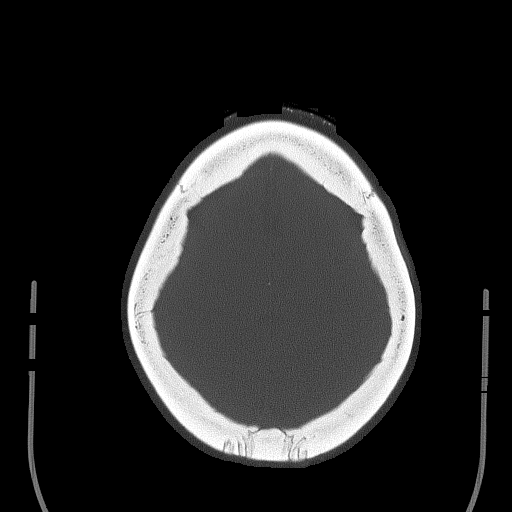
[im 61/72  brain]
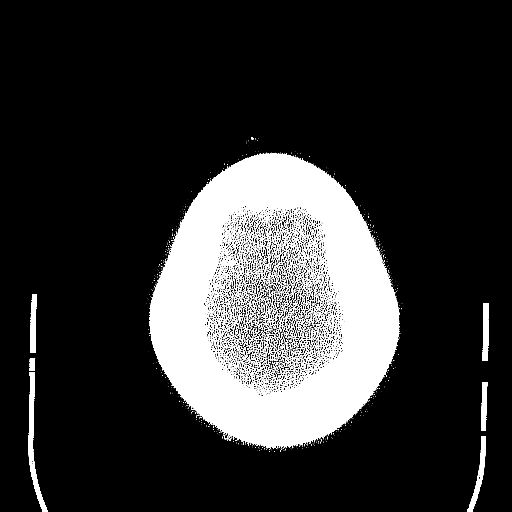
[im 64/72  brain]
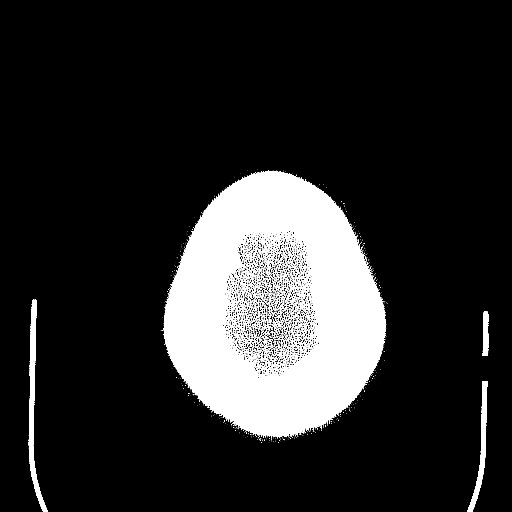
[im 68/72  brain]
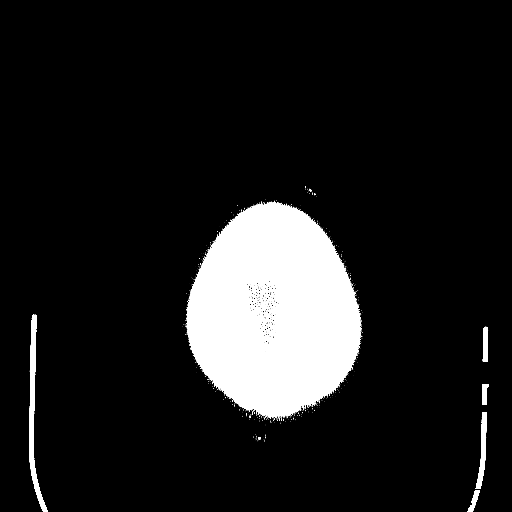

[16 of 30 positions shown; findings below may reference images not displayed]

FINDINGS: Visualized paranasal sinuses and mastoids are clear. No acute
osseous abnormality identified. Visualized orbit soft tissues are
within normal limits. Negative scalp soft tissues (right occipital
sebaceous cyst).

Cerebral volume is normal. Partially empty sella configuration. No
midline shift, ventriculomegaly, mass effect, evidence of mass
lesion, intracranial hemorrhage or evidence of cortically based
acute infarction. Gray-white matter differentiation is within normal
limits throughout the brain. No suspicious intracranial vascular
hyperdensity.
IMPRESSION: Negative noncontrast CT appearance of the brain.

## 2017-02-13 ENCOUNTER — Other Ambulatory Visit: Payer: Self-pay | Admitting: Internal Medicine

## 2017-02-13 DIAGNOSIS — Z1231 Encounter for screening mammogram for malignant neoplasm of breast: Secondary | ICD-10-CM

## 2017-02-14 ENCOUNTER — Ambulatory Visit
Admission: RE | Admit: 2017-02-14 | Discharge: 2017-02-14 | Disposition: A | Discharge status: Home / Self Care | Payer: PRIVATE HEALTH INSURANCE | Source: Ambulatory Visit | Attending: Internal Medicine | Admitting: Internal Medicine

## 2017-02-14 DIAGNOSIS — Z1231 Encounter for screening mammogram for malignant neoplasm of breast: Secondary | ICD-10-CM

## 2017-02-15 ENCOUNTER — Other Ambulatory Visit: Payer: Self-pay | Admitting: Internal Medicine

## 2017-02-15 DIAGNOSIS — R928 Other abnormal and inconclusive findings on diagnostic imaging of breast: Secondary | ICD-10-CM

## 2017-02-16 ENCOUNTER — Other Ambulatory Visit: Payer: Self-pay | Admitting: Internal Medicine

## 2017-02-16 DIAGNOSIS — M79605 Pain in left leg: Secondary | ICD-10-CM

## 2017-02-16 DIAGNOSIS — R609 Edema, unspecified: Secondary | ICD-10-CM

## 2017-02-20 ENCOUNTER — Other Ambulatory Visit: Payer: Self-pay | Admitting: Internal Medicine

## 2017-02-20 ENCOUNTER — Ambulatory Visit
Admission: RE | Admit: 2017-02-20 | Discharge: 2017-02-20 | Disposition: A | Discharge status: Home / Self Care | Payer: PRIVATE HEALTH INSURANCE | Source: Ambulatory Visit | Attending: Internal Medicine | Admitting: Internal Medicine

## 2017-02-20 DIAGNOSIS — N63 Unspecified lump in unspecified breast: Secondary | ICD-10-CM

## 2017-02-20 DIAGNOSIS — R928 Other abnormal and inconclusive findings on diagnostic imaging of breast: Secondary | ICD-10-CM

## 2017-03-01 ENCOUNTER — Ambulatory Visit
Admission: RE | Admit: 2017-03-01 | Discharge: 2017-03-01 | Disposition: A | Discharge status: Home / Self Care | Payer: PRIVATE HEALTH INSURANCE | Source: Ambulatory Visit | Attending: Internal Medicine | Admitting: Internal Medicine

## 2017-03-01 DIAGNOSIS — M79605 Pain in left leg: Secondary | ICD-10-CM

## 2017-03-01 DIAGNOSIS — R609 Edema, unspecified: Secondary | ICD-10-CM

## 2017-08-29 ENCOUNTER — Ambulatory Visit: Payer: PRIVATE HEALTH INSURANCE

## 2017-08-29 ENCOUNTER — Ambulatory Visit
Admission: RE | Admit: 2017-08-29 | Discharge: 2017-08-29 | Disposition: A | Discharge status: Home / Self Care | Payer: PRIVATE HEALTH INSURANCE | Source: Ambulatory Visit | Attending: Internal Medicine | Admitting: Internal Medicine

## 2017-08-29 DIAGNOSIS — N63 Unspecified lump in unspecified breast: Secondary | ICD-10-CM

## 2017-11-02 IMAGING — US US EXTREM LOW VENOUS*L*
1 series · 13 of 24 positions shown · non-contrast
Comparison: None.

CLINICAL DATA: Left lower extremity pain and edema. Evaluate for
DVT.



[Series 1: us extrem low venous*left* · 0.08mm/px · 13 of 30 slices shown]
[im 1/30]
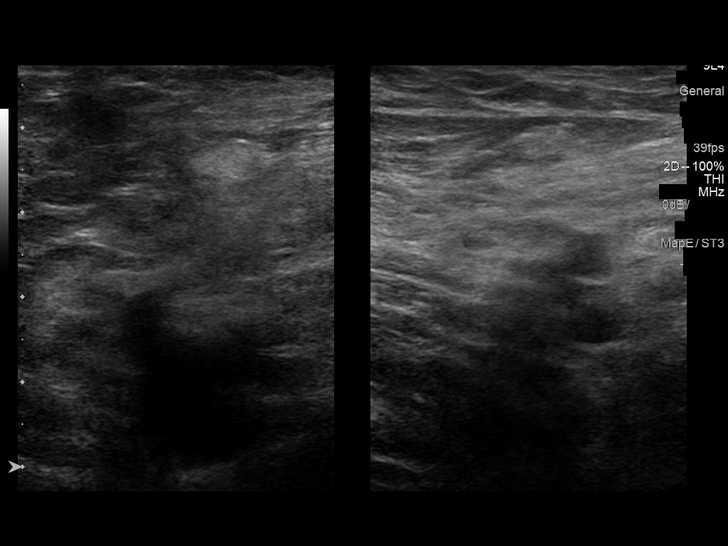
[im 3/30]
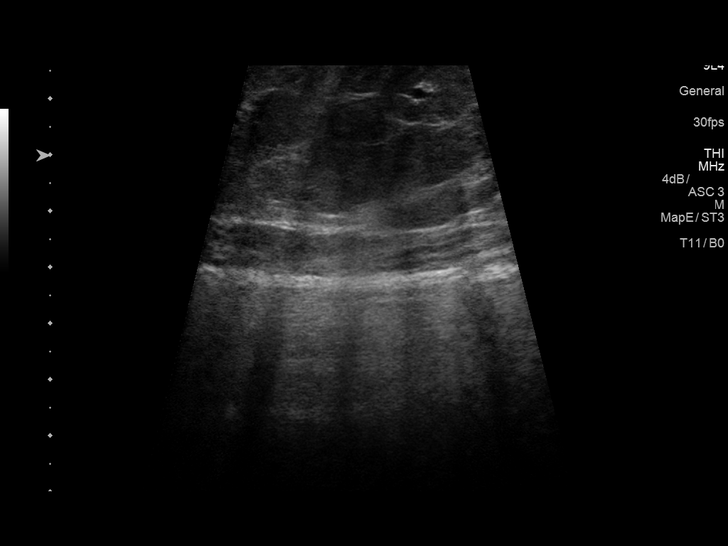
[im 6/30]
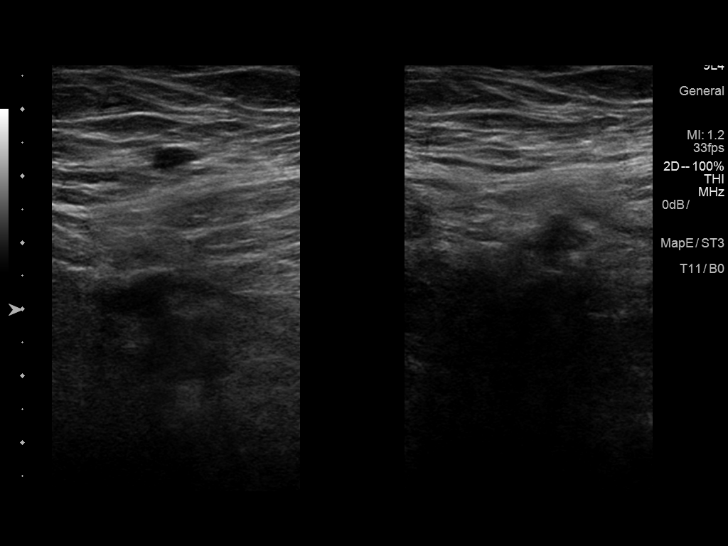
[im 8/30]
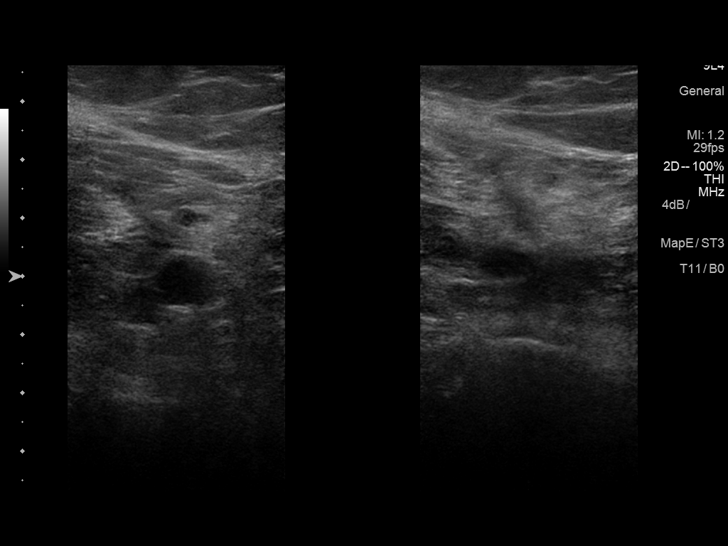
[im 11/30]
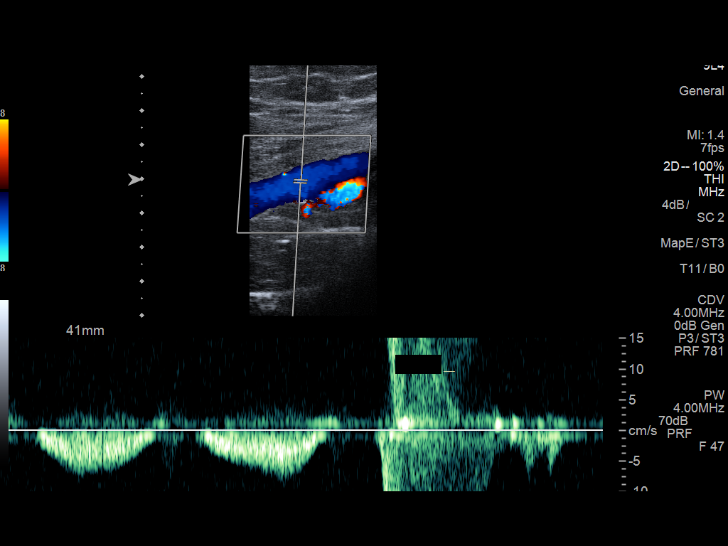
[im 13/30]
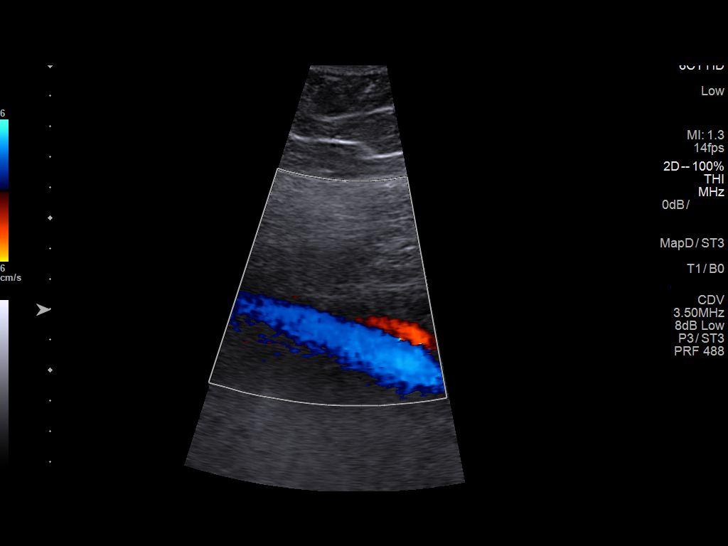
[im 16/30]
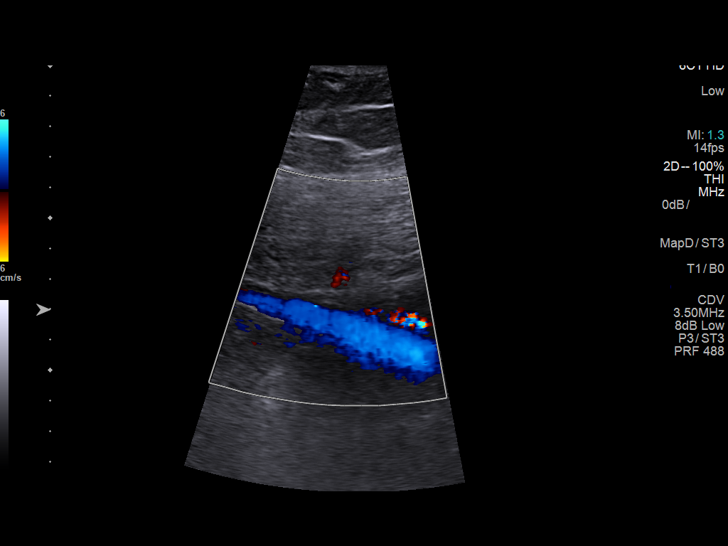
[im 17/30]
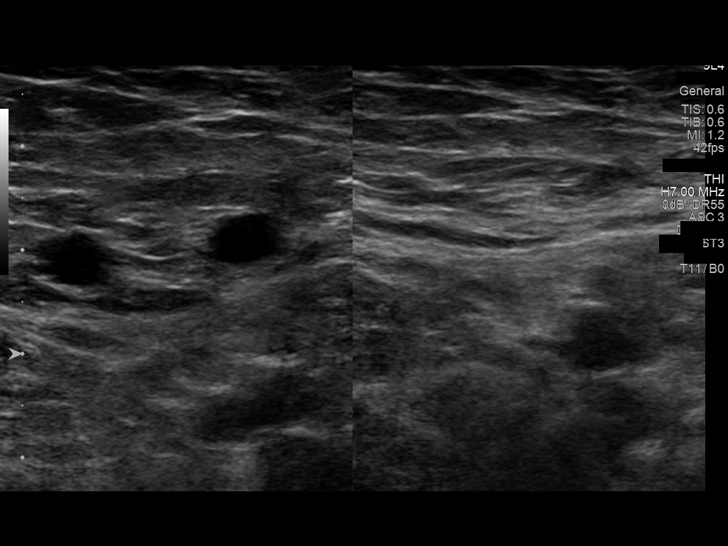
[im 19/30]
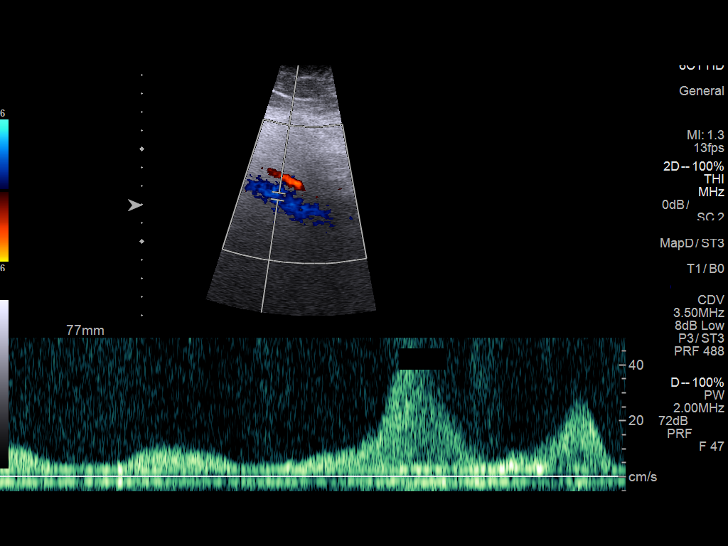
[im 22/30]
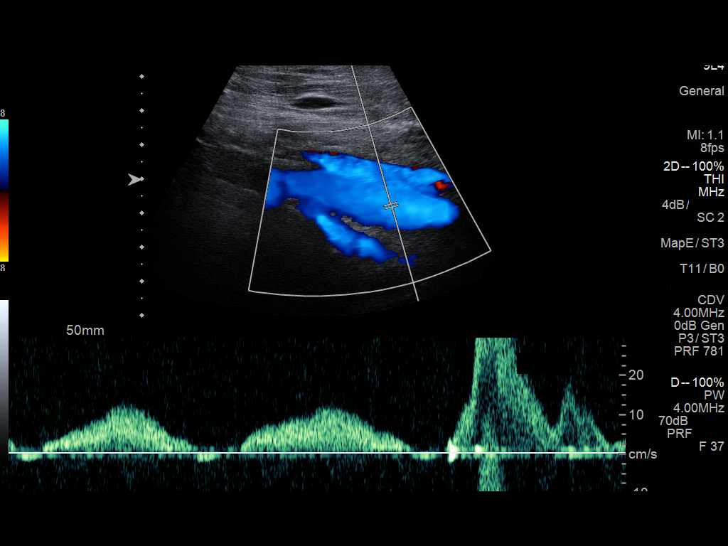
[im 24/30]
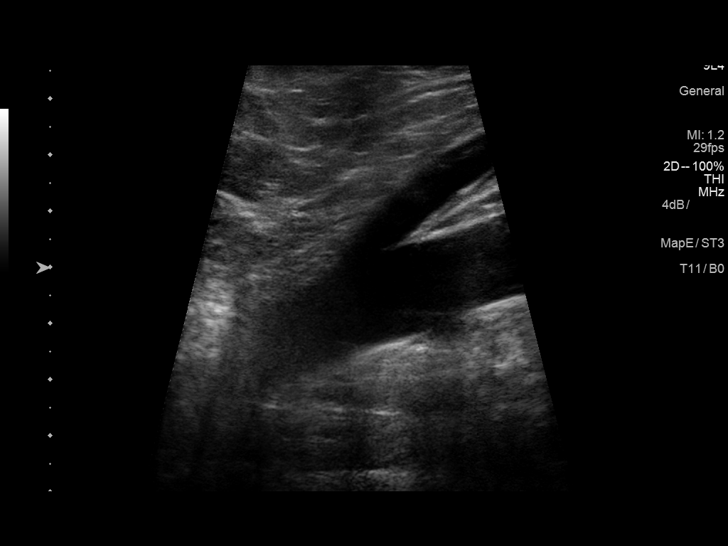
[im 27/30]
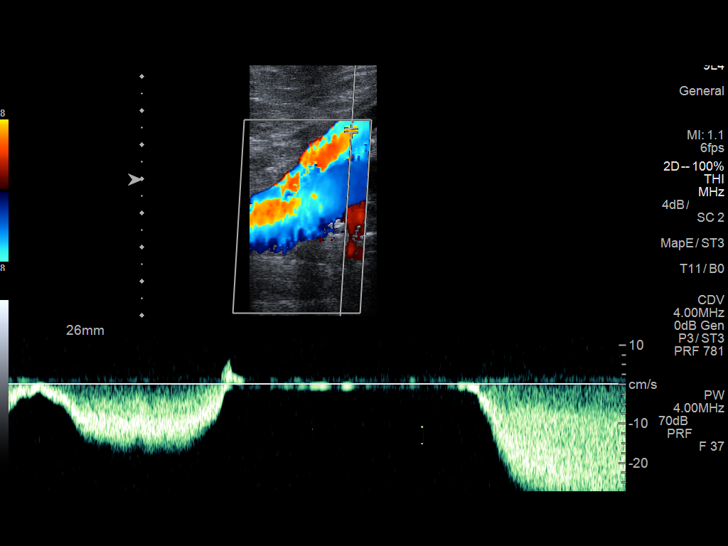
[im 30/30]
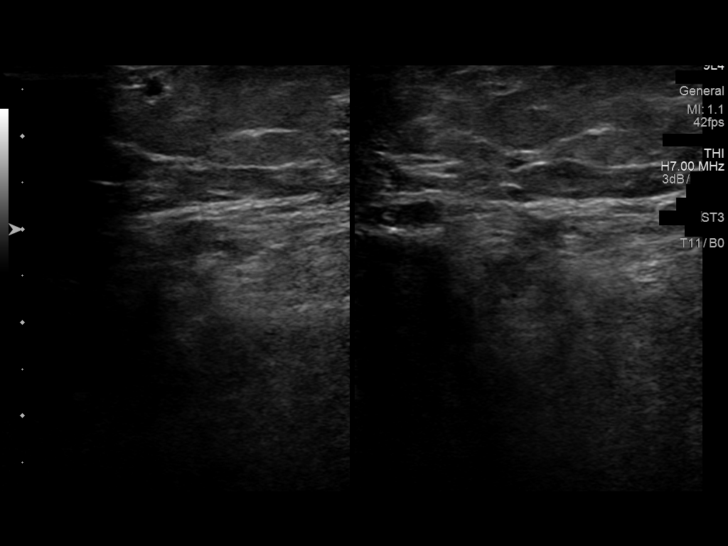

[13 of 24 positions shown; findings below may reference images not displayed]

FINDINGS: Examination is degraded due to patient body habitus and poor
sonographic window.

Contralateral Common Femoral Vein: Respiratory phasicity is normal
and symmetric with the symptomatic side. No evidence of thrombus.
Normal compressibility.

Common Femoral Vein: No evidence of thrombus. Normal
compressibility, respiratory phasicity and response to augmentation.

Saphenofemoral Junction: No evidence of thrombus. Normal
compressibility and flow on color Doppler imaging.

Profunda Femoral Vein: No evidence of thrombus. Normal
compressibility and flow on color Doppler imaging.

Femoral Vein: No evidence of thrombus. Normal compressibility,
respiratory phasicity and response to augmentation.

Popliteal Vein: No evidence of thrombus. Normal compressibility,
respiratory phasicity and response to augmentation.

Calf Veins: No evidence of thrombus. Normal compressibility and flow
on color Doppler imaging.

Superficial Great Saphenous Vein: No evidence of thrombus. Normal
compressibility and flow on color Doppler imaging.

Venous Reflux:  None.

Other Findings: A patent superficial vein is seen at the patient's
palpable area of concern involving the left thigh (image 6).
IMPRESSION: 1. No evidence of DVT within the left lower extremity.
2. A patent superficial vein is seen at the patient's palpable area
of concern involving the left thigh.

## 2018-01-25 ENCOUNTER — Other Ambulatory Visit: Payer: Self-pay | Admitting: Physician Assistant

## 2018-01-25 DIAGNOSIS — Z1231 Encounter for screening mammogram for malignant neoplasm of breast: Secondary | ICD-10-CM
